# Patient Record
Sex: Male | Born: 1960 | Race: White | Hispanic: No | Marital: Married | Smoking: Former smoker
Health system: Southern US, Community
[De-identification: ages and names within clinical notes are randomized; demographics above are authoritative.]

## PROBLEM LIST (undated history)

## (undated) DIAGNOSIS — K5190 Ulcerative colitis, unspecified, without complications: Secondary | ICD-10-CM

## (undated) DIAGNOSIS — F419 Anxiety disorder, unspecified: Secondary | ICD-10-CM

## (undated) DIAGNOSIS — K5090 Crohn's disease, unspecified, without complications: Secondary | ICD-10-CM

## (undated) HISTORY — PX: NASAL SEPTUM SURGERY: SHX37

## (undated) HISTORY — PX: OTHER SURGICAL HISTORY: SHX169

## (undated) HISTORY — DX: Ulcerative colitis, unspecified, without complications: K51.90

## (undated) HISTORY — DX: Crohn's disease, unspecified, without complications: K50.90

---

## 2000-09-22 ENCOUNTER — Ambulatory Visit (HOSPITAL_COMMUNITY): Admission: RE | Admit: 2000-09-22 | Discharge: 2000-09-22 | Payer: Self-pay | Admitting: Gastroenterology

## 2002-02-14 ENCOUNTER — Ambulatory Visit (HOSPITAL_BASED_OUTPATIENT_CLINIC_OR_DEPARTMENT_OTHER): Admission: RE | Admit: 2002-02-14 | Discharge: 2002-02-14 | Payer: Self-pay | Admitting: Otolaryngology

## 2002-04-08 ENCOUNTER — Ambulatory Visit (HOSPITAL_BASED_OUTPATIENT_CLINIC_OR_DEPARTMENT_OTHER): Admission: RE | Admit: 2002-04-08 | Discharge: 2002-04-08 | Payer: Self-pay | Admitting: Otolaryngology

## 2003-08-19 ENCOUNTER — Ambulatory Visit (HOSPITAL_COMMUNITY): Admission: RE | Admit: 2003-08-19 | Discharge: 2003-08-19 | Payer: Self-pay | Admitting: Gastroenterology

## 2004-02-20 ENCOUNTER — Ambulatory Visit (HOSPITAL_BASED_OUTPATIENT_CLINIC_OR_DEPARTMENT_OTHER): Admission: RE | Admit: 2004-02-20 | Discharge: 2004-02-20 | Payer: Self-pay | Admitting: Otolaryngology

## 2010-10-11 ENCOUNTER — Other Ambulatory Visit: Payer: Self-pay | Admitting: Family Medicine

## 2010-10-11 DIAGNOSIS — R748 Abnormal levels of other serum enzymes: Secondary | ICD-10-CM

## 2010-10-18 ENCOUNTER — Ambulatory Visit
Admission: RE | Admit: 2010-10-18 | Discharge: 2010-10-18 | Disposition: A | Discharge status: Home / Self Care | Payer: BC Managed Care – PPO | Source: Ambulatory Visit | Attending: Family Medicine | Admitting: Family Medicine

## 2010-10-18 DIAGNOSIS — R748 Abnormal levels of other serum enzymes: Secondary | ICD-10-CM

## 2012-11-01 ENCOUNTER — Other Ambulatory Visit: Payer: Self-pay | Admitting: Family Medicine

## 2012-11-01 DIAGNOSIS — H9312 Tinnitus, left ear: Secondary | ICD-10-CM

## 2012-11-08 ENCOUNTER — Other Ambulatory Visit: Payer: BC Managed Care – PPO

## 2012-12-06 ENCOUNTER — Ambulatory Visit
Admission: RE | Admit: 2012-12-06 | Discharge: 2012-12-06 | Disposition: A | Discharge status: Home / Self Care | Payer: Self-pay | Source: Ambulatory Visit | Attending: Family Medicine | Admitting: Family Medicine

## 2012-12-06 DIAGNOSIS — H9312 Tinnitus, left ear: Secondary | ICD-10-CM

## 2012-12-15 ENCOUNTER — Ambulatory Visit
Admission: RE | Admit: 2012-12-15 | Discharge: 2012-12-15 | Disposition: A | Discharge status: Home / Self Care | Payer: BC Managed Care – PPO | Source: Ambulatory Visit | Attending: Family Medicine | Admitting: Family Medicine

## 2012-12-15 MED ORDER — GADOBENATE DIMEGLUMINE 529 MG/ML IV SOLN
20.0000 mL | Freq: Once | INTRAVENOUS | Status: AC | PRN
  Administered 2012-12-15: 20 mL via INTRAVENOUS

## 2013-08-26 ENCOUNTER — Ambulatory Visit (INDEPENDENT_AMBULATORY_CARE_PROVIDER_SITE_OTHER): Payer: BC Managed Care – PPO | Admitting: Diagnostic Neuroimaging

## 2013-08-26 ENCOUNTER — Encounter (INDEPENDENT_AMBULATORY_CARE_PROVIDER_SITE_OTHER): Payer: Self-pay

## 2013-08-26 ENCOUNTER — Encounter: Payer: Self-pay | Admitting: Diagnostic Neuroimaging

## 2013-08-26 VITALS — BP 118/79 | HR 75 | Ht 74.0 in | Wt 207.0 lb

## 2013-08-26 DIAGNOSIS — F0781 Postconcussional syndrome: Secondary | ICD-10-CM
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
## 2013-08-26 NOTE — Patient Instructions (Signed)
Consider referral to psychologist/psychiatry for mood eval and treatment.  Consider repeat sleep study.  Continue physical activity and good healthy habits.

## 2015-07-16 ENCOUNTER — Other Ambulatory Visit: Payer: Self-pay | Admitting: Gastroenterology

## 2016-01-16 ENCOUNTER — Encounter (HOSPITAL_COMMUNITY): Payer: Self-pay | Admitting: Emergency Medicine

## 2016-01-16 ENCOUNTER — Emergency Department (HOSPITAL_COMMUNITY)
Admission: EM | Admit: 2016-01-16 | Discharge: 2016-01-16 | Disposition: A | Discharge status: Home / Self Care | Payer: BLUE CROSS/BLUE SHIELD | Attending: Emergency Medicine | Admitting: Emergency Medicine

## 2016-01-16 DIAGNOSIS — Z79899 Other long term (current) drug therapy: Secondary | ICD-10-CM | POA: Diagnosis not present

## 2016-01-16 DIAGNOSIS — I10 Essential (primary) hypertension: Secondary | ICD-10-CM | POA: Insufficient documentation

## 2016-01-16 DIAGNOSIS — R42 Dizziness and giddiness: Secondary | ICD-10-CM

## 2016-01-16 DIAGNOSIS — Z87891 Personal history of nicotine dependence: Secondary | ICD-10-CM | POA: Diagnosis not present

## 2016-01-16 HISTORY — DX: Anxiety disorder, unspecified: F41.9

## 2016-01-16 LAB — CBC WITH DIFFERENTIAL/PLATELET
BASOS PCT: 1 %
Basophils Absolute: 0 10*3/uL (ref 0.0–0.1)
EOS ABS: 0.1 10*3/uL (ref 0.0–0.7)
Eosinophils Relative: 2 %
HCT: 42.8 % (ref 39.0–52.0)
HEMOGLOBIN: 15 g/dL (ref 13.0–17.0)
Lymphocytes Relative: 24 %
Lymphs Abs: 1.2 10*3/uL (ref 0.7–4.0)
MCH: 31.6 pg (ref 26.0–34.0)
MCHC: 35 g/dL (ref 30.0–36.0)
MCV: 90.3 fL (ref 78.0–100.0)
MONOS PCT: 10 %
Monocytes Absolute: 0.5 10*3/uL (ref 0.1–1.0)
NEUTROS PCT: 63 %
Neutro Abs: 3.2 10*3/uL (ref 1.7–7.7)
Platelets: 205 10*3/uL (ref 150–400)
RBC: 4.74 MIL/uL (ref 4.22–5.81)
RDW: 12.4 % (ref 11.5–15.5)
WBC: 5 10*3/uL (ref 4.0–10.5)

## 2016-01-16 LAB — COMPREHENSIVE METABOLIC PANEL
ALK PHOS: 65 U/L (ref 38–126)
ALT: 32 U/L (ref 17–63)
AST: 26 U/L (ref 15–41)
Albumin: 4.2 g/dL (ref 3.5–5.0)
Anion gap: 10 (ref 5–15)
BILIRUBIN TOTAL: 0.8 mg/dL (ref 0.3–1.2)
BUN: 22 mg/dL — ABNORMAL HIGH (ref 6–20)
CALCIUM: 8.8 mg/dL — AB (ref 8.9–10.3)
CO2: 21 mmol/L — AB (ref 22–32)
CREATININE: 1.09 mg/dL (ref 0.61–1.24)
Chloride: 107 mmol/L (ref 101–111)
Glucose, Bld: 114 mg/dL — ABNORMAL HIGH (ref 65–99)
Potassium: 4.2 mmol/L (ref 3.5–5.1)
Sodium: 138 mmol/L (ref 135–145)
Total Protein: 7.3 g/dL (ref 6.5–8.1)

## 2016-01-16 LAB — I-STAT TROPONIN, ED: TROPONIN I, POC: 0.01 ng/mL (ref 0.00–0.08)

## 2016-01-16 MED ORDER — THIAMINE HCL 100 MG/ML IJ SOLN
100.0000 mg | Freq: Once | INTRAMUSCULAR | Status: AC
  Administered 2016-01-16: 100 mg via INTRAVENOUS
  Filled 2016-01-16: qty 2

## 2016-01-16 MED ORDER — MECLIZINE HCL 25 MG PO TABS
25.0000 mg | ORAL_TABLET | Freq: Once | ORAL | Status: AC
  Administered 2016-01-16: 25 mg via ORAL
  Filled 2016-01-16: qty 1

## 2016-01-16 MED ORDER — SODIUM CHLORIDE 0.9 % IV BOLUS (SEPSIS)
500.0000 mL | Freq: Once | INTRAVENOUS | Status: AC
  Administered 2016-01-16: 500 mL via INTRAVENOUS

## 2016-01-16 MED ORDER — MECLIZINE HCL 32 MG PO TABS: 32.0000 mg | ORAL_TABLET | Freq: Three times a day (TID) | ORAL | 0 refills | Status: DC | PRN

## 2016-01-16 NOTE — ED Triage Notes (Signed)
Patient states having dizziness and "feels drunk but I havent drunk anything" and symptoms are worse with standing and moving.  Patient states that he has HTN at times but denies taking any medications for it.  States that he has Lexapro for anxiety but doesn't take everyday.

## 2016-01-16 NOTE — Discharge Instructions (Signed)
We believe your symptoms were caused by benign vertigo.  Please read through the included information and take any prescribed medication(s).  Follow up with your doctor as listed above.  If you develop any new or worsening symptoms that concern you, including but not limited to persistent dizziness/vertigo, numbness or weakness in your arms or legs, altered mental status, persistent vomiting, or fever greater than 101, please return immediately to the Emergency Department.  
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
## 2016-01-20 ENCOUNTER — Ambulatory Visit (INDEPENDENT_AMBULATORY_CARE_PROVIDER_SITE_OTHER): Payer: BLUE CROSS/BLUE SHIELD | Admitting: Neurology

## 2016-01-20 ENCOUNTER — Encounter: Payer: Self-pay | Admitting: Neurology

## 2016-01-20 DIAGNOSIS — H9319 Tinnitus, unspecified ear: Secondary | ICD-10-CM | POA: Insufficient documentation

## 2016-01-20 DIAGNOSIS — H9311 Tinnitus, right ear: Secondary | ICD-10-CM | POA: Diagnosis not present

## 2016-01-20 DIAGNOSIS — R42 Dizziness and giddiness: Secondary | ICD-10-CM
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
## 2016-01-20 NOTE — Patient Instructions (Addendum)
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 I recommend checking MRI scan of the brain with and without contrast with thin sections through the internal auditory canals to rule out structural lesions. Referred to vestibular rehabilitation for dizziness exercises. I advised him to take Lexapro daily for his underlying anxiety. Return for follow-up in 2 months or call earlier if necessary.   Dizziness Dizziness is a common problem. It is a feeling of unsteadiness or light-headedness. You may feel like you are about to faint. Dizziness can lead to injury if you stumble or fall. Anyone can become dizzy, but dizziness is more common in older adults. This condition can be caused by a number of things, including medicines, dehydration, or illness. HOME CARE INSTRUCTIONS Taking these steps may help with your condition: Eating and Drinking  Drink enough fluid to keep your urine clear or pale yellow. This helps to keep you from becoming dehydrated. Try to drink more clear fluids, such as water.  Do not drink alcohol.  Limit your caffeine intake if directed by your health care provider.  Limit your salt intake if directed by your health care provider. Activity  Avoid making quick movements.  Rise slowly from chairs and steady yourself until you feel okay.  In the morning, first sit up on the side of the bed. When you feel okay, stand slowly while you hold onto something until you know that your balance is fine.  Move your legs often if you need to stand in one place for a long time. Tighten and relax your muscles in your legs while you are standing.  Do not drive or operate heavy machinery if you feel dizzy.  Avoid bending down if you feel dizzy. Place items in your home so that they are easy for you to reach without leaning  over. Lifestyle  Do not use any tobacco products, including cigarettes, chewing tobacco, or electronic cigarettes. If you need help quitting, ask your health care provider.  Try to reduce your stress level, such as with yoga or meditation. Talk with your health care provider if you need help. General Instructions  Watch your dizziness for any changes.  Take medicines only as directed by your health care provider. Talk with your health care provider if you think that your dizziness is caused by a medicine that you are taking.  Tell a friend or a family member that you are feeling dizzy. If he or she notices any changes in your behavior, have this person call your health care provider.  Keep all follow-up visits as directed by your health care provider. This is important. SEEK MEDICAL CARE IF:  Your dizziness does not go away.  Your dizziness or light-headedness gets worse.  You feel nauseous.  You have reduced hearing.  You have new symptoms.  You are unsteady on your feet or you feel like the room is spinning. SEEK IMMEDIATE MEDICAL CARE IF:  You vomit or have diarrhea and are unable to eat or drink anything.  You have problems talking, walking, swallowing, or using your arms, hands, or legs.  You feel generally weak.  You are not thinking clearly or you have trouble forming sentences. It may take a friend or family member to notice this.  You have chest pain, abdominal pain, shortness of breath, or sweating.  Your vision changes.  You notice any bleeding.  You have a headache.  You have neck pain or a stiff neck.  You have a fever.   This information is not intended to replace advice given to you by your health care provider. Make sure you discuss any questions you have with your health care provider.   Document Released: 11/16/2000 Document Revised: 10/07/2014 Document Reviewed: 05/19/2014 Elsevier Interactive Patient Education [employer]2016 Elsevier Inc.

## 2016-02-12 ENCOUNTER — Ambulatory Visit: Payer: BLUE CROSS/BLUE SHIELD | Attending: Neurology | Admitting: Physical Therapy

## 2016-02-12 DIAGNOSIS — R2681 Unsteadiness on feet: Secondary | ICD-10-CM

## 2016-02-12 DIAGNOSIS — R42 Dizziness and giddiness: Secondary | ICD-10-CM | POA: Diagnosis present

## 2016-02-14 NOTE — Patient Instructions (Signed)
Gaze Stabilization: Tip Card 1.Target must remain in focus, not blurry, and appear stationary while head is in motion. 2.Perform exercises with small head movements (45 to either side of midline). 3.Increase speed of head motion so long as target is in focus. 4.If you wear eyeglasses, be sure you can see target through lens (therapist will give specific instructions for bifocal / progressive lenses). 5.These exercises may provoke dizziness or nausea. Work through these symptoms. If too dizzy, slow head movement slightly. Rest between each exercise. 6.Exercises demand concentration; avoid distractions. 7.For safety, perform standing exercises close to a counter, wall, corner, or next to someone.  Copyright  VHI. All rights reserved.  Gaze Stabilization: Standing Feet Apart   Feet shoulder width apart, keeping eyes on target on wall __6__ feet away, tilt head down 15-30 and move head side to side for __60__ seconds. Repeat while moving head up and down for __60__ seconds. Do _3-5___ sessions per day. Repeat using target on pattern background.  Copyright  VHI. All rights reserved.   

## 2016-02-14 NOTE — Therapy (Signed)
[facility] [address] [place], [place], [address] Phone: [phone]   Fax:  [phone]  Physical Therapy Evaluation  Patient Details  Name: Donald Norris MRN: [id number] Date of Birth: [birth year] Referring Provider: Dr. Delia Heady  Encounter Date: 02/12/2016      PT End of Session - 02/14/16 1221    Visit Number 1   Number of Visits 5  eval + 4 visits   Date for PT Re-Evaluation 03/13/16   Authorization Type BCBS   PT Start Time 0800   PT Stop Time 0845   PT Time Calculation (min) 45 min      Past Medical History:  Diagnosis Date  . Anxiety   . Crohn disease (HCC)   . Ulcerative colitis [facility])     Past Surgical History:  Procedure Laterality Date  . NASAL SEPTUM SURGERY  1998, 2005   x 2  . sinus surgey      There were no vitals filed for this visit.       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 No/denies            [facility] PT Assessment - 02/14/16 0001      Assessment   Medical Diagnosis Dizziness   Referring Provider Dr. Delia Heady   Onset Date/Surgical Date 01/16/16     Precautions   Precautions None     Balance Screen   Has the patient fallen in the past 6 months No   Has the patient had a decrease in activity level because of a fear of falling?  No   Is the patient reluctant to leave their home because of a fear of falling?  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 old male who reports acute onset of vertigo in am of 01-16-16:  states it has significantly improved since onset but states he still doesn't feel normal     Symptom Behavior   Type of Dizziness Unsteady with head/body turns   Frequency of Dizziness constant  varies in intensity   Aggravating Factors Turning head quickly   Relieving Factors Rest  Occulomotor Exam   Occulomotor Alignment Normal   Smooth Pursuits Intact   Saccades Intact     Visual Acuity   Static line 10   Dynamic line 7     Positional Testing   Sidelying Test Sidelying Right;Sidelying Left     Sidelying Right   Sidelying Right Duration none  reports light-headedness with return to upright   Sidelying Right Symptoms No nystagmus     Sidelying Left   Sidelying Left Duration no dizziness reported in sidelying position  c/o light-headedness with return to upright    Sidelying Left Symptoms No nystagmus                       PT Education - 02/14/16 1220    Education provided Yes   Education Details x1 viewing in standing   Person(s) Educated Patient   Methods Explanation;Demonstration;Handout   Comprehension Verbalized understanding;Returned demonstration             PT Long Term Goals - 02/14/16 1226      PT LONG TERM GOAL #1   Title Perform SOT and establish LTG as appropriate.  (03-13-16)   Time 4   Period Weeks   Status New     PT LONG TERM GOAL #2   Title Pt will improve DVA to </= 2 line difference to demo improved VOR function.  (03-13-16)   Baseline 3 line difference   Time 4   Period Weeks   Status New     PT LONG TERM GOAL #3   Title Independent in HEP for vestibular/balance exercises.  (03-13-16)   Time 4   Period Weeks   Status New     PT  LONG TERM GOAL #4   Title Report at least 50% improvement in vertigo.  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     Rehab Potential Good   PT Frequency 1x / week   PT Duration 4 weeks   PT Treatment/Interventions ADLs/Self Care Home Management;Canalith Repostioning;Gait training;Therapeutic activities;Patient/family education;Neuromuscular re-education;Vestibular   PT Next Visit Plan Do SOT and establish HEP as appropriate   PT Home Exercise Plan x1 viewing in standing   Consulted and Agree with Plan of Care Patient      Patient will benefit from skilled therapeutic intervention in order to improve the following deficits and impairments:  Dizziness, Decreased balance  Visit Diagnosis: Dizziness and giddiness - Plan: PT plan of care cert/re-cert  Unsteadiness on feet - Plan: PT plan of care cert/re-cert     Problem List Patient Active Problem List   Diagnosis Date Noted  . Dizziness and giddiness 01/20/2016  . Tinnitus 01/20/2016  . Post concussion syndrome 08/26/2013    Kary KosDilday, Candance Bohlman Suzanne, PT 02/14/2016, 12:37 PM  [facility] [address] [place], [place], [address] Phone: [phone]   Fax:  [phone]  Name: Donald Norris MRN: [id number] Date of Birth: 03-06-1961

## 2016-02-15 ENCOUNTER — Ambulatory Visit: Payer: BLUE CROSS/BLUE SHIELD | Admitting: Physical Therapy

## 2016-02-15 ENCOUNTER — Ambulatory Visit (INDEPENDENT_AMBULATORY_CARE_PROVIDER_SITE_OTHER): Payer: BLUE CROSS/BLUE SHIELD

## 2016-02-15 DIAGNOSIS — R42 Dizziness and giddiness: Secondary | ICD-10-CM | POA: Diagnosis not present

## 2016-02-15 DIAGNOSIS — H9311 Tinnitus, right ear: Secondary | ICD-10-CM

## 2016-02-17 NOTE — Therapy (Signed)
[facility] [address] [place], [place], [address] Phone: [phone]   Fax:  [phone]  Physical Therapy Treatment  Patient Details  Name: Donald Norris MRN: [id number] Date of Birth: [birth year] Referring Provider: Dr. Antony Contras  Encounter Date: 02/15/2016      PT End of Session - 02/17/16 1036    Visit Number 2   Number of Visits 5   Date for PT Re-Evaluation 03/13/16   Authorization Type BCBS   PT Start Time 1315   PT Stop Time 6468   PT Time Calculation (min) 43 min      Past Medical History:  Diagnosis Date  . Anxiety   . Crohn disease ([place])   . Ulcerative colitis [facility])     Past Surgical History:  Procedure Laterality Date  . [place], 2005   x 2  . sinus surgey      There were no vitals filed for this visit.      Subjective Assessment - 02/17/16 1035    Subjective Pt reports Saturday was "a bad day" - was playing a racing video game on big screen TV and had to go lie down - was sensitive to light and movement after this activity   Patient Stated Goals resolve the vertigo   Currently in Pain? No/denies     NeuroRe-ed: Marketing executive test score WNL's with score 79/100 (N= 70/100) All inputs of somatosensory, visual and vestibular are WNL's  Pt had no falls during any trials of SOT test with only 3 trials scoring slightly below normal -- no vestibular dysfunction Detected with balance with this test  Dynamic visual acuity test now 2 line difference - tested without pt wearing progressive eyeglasses - he reports he thinks the glasses  Worn during testing on eval contributed to abnormal score as they are progressive lenses and things are blurred with quick head Movement with glasses worn   Self care; Discussed LTG's, progress and current status  Pt appears to have initially had BPPV which has now resolved - with symptoms now appearing to be of migrainous in  nature with no Vestibular hypofunction noted at this time -  Pt to follow up with MD for further diagnostic testing (BAER test scheduled)                                 PT Long Term Goals - 02/17/16 1040      PT LONG TERM GOAL #1   Title Perform SOT and establish LTG as appropriate.  (03-13-16)   Baseline completed 02-15-16 ; score WNL's   Status Achieved     PT LONG TERM GOAL #2   Title Pt will improve DVA to </= 2 line difference to demo improved VOR function.  (03-13-16)   Baseline 2 line difference on 02-15-16   Status Achieved     PT LONG TERM GOAL #3   Title Independent in HEP for vestibular/balance exercises.  (03-13-16)   Baseline met 02-15-16   Status Achieved     PT LONG TERM GOAL #4   Title Report at least 50% improvement in vertigo.  (03-13-16)   Baseline met 02-15-16   Status Achieved               Plan - 02/17/16 1037    Clinical Impression Statement Sensory Organization test score WNL's with all inputs of somatosensory, visual and vestibular WNL's --  no vestibular deficit noted per SOT in maintaining balance:  pt symptoms appear to be possibly vertiginous migraine in nature - pt is scheduled for BAER testing this pm with neurologist; DVA is WNL's with 2 line difference - symptoms do not appear to be of vestibular system dysfunction at this time - pt to follow up with MD   Rehab Potential Good   PT Frequency 1x / week   PT Duration 4 weeks   PT Treatment/Interventions ADLs/Self Care Home Management;Canalith Repostioning;Gait training;Therapeutic activities;Patient/family education;Neuromuscular re-education;Vestibular   PT Next Visit Plan D/C - SOT WNL's and DVA now WNL's   Consulted and Agree with Plan of Care Patient      Patient will benefit from skilled therapeutic intervention in order to improve the following deficits and impairments:  Dizziness, Decreased balance  Visit Diagnosis: Dizziness and giddiness     Problem  List Patient Active Problem List   Diagnosis Date Noted  . Dizziness and giddiness 01/20/2016  . Tinnitus 01/20/2016  . Post concussion syndrome 08/26/2013     PHYSICAL THERAPY DISCHARGE SUMMARY  Visits from Start of Care: 2  Current functional level related to goals / functional outcomes: See above for progress towards goals   Remaining deficits: C/o headache at times with activity requiring visual stimulation/oculomotor (i.e. Racing video game on big screen TV) - migrainous in nature? Pt appears to have had BPPV initially but this has resolved as of current time   DVA and SOT scores are WNL's with balance WNL's Education / Equipment: Pt was initially instructed in gaze stabilization, x1 viewing, for HEP - now DVA is WNL's and balance is WNL's - this exercise is no longer needed  Pt to follow up with MD for further diagnostic testing Plan: Patient agrees to discharge.  Patient goals were met. Patient is being discharged due to meeting the stated rehab goals.  ?????       Alda Lea, PT 02/17/2016, 10:42 AM  [facility] [address] [place], [place], [address] Phone: [phone]   Fax:  [phone]  Name: Donald Norris MRN: [id number] Date of Birth: Oct 22, 1960

## 2016-02-29 ENCOUNTER — Telehealth: Payer: Self-pay

## 2016-02-29 NOTE — Telephone Encounter (Signed)
LFt vm for patient to call back about Brainstem auditory potentials test.

## 2016-02-29 NOTE — Telephone Encounter (Signed)
-----   Message from Micki RileyPramod S Sethi, MD sent at 02/25/2016  3:27 PM EDT ----- Donald RoachKindly inform the patient that brainstem auditory potentials were normal bilaterally.

## 2016-03-01 NOTE — Telephone Encounter (Signed)
Rn call patient that his brainstem auditory potentials were normal bilaterally. Pt was happy and verbalized understanding.

## 2016-10-21 DIAGNOSIS — E785 Hyperlipidemia, unspecified: Secondary | ICD-10-CM | POA: Diagnosis not present

## 2016-10-21 DIAGNOSIS — N183 Chronic kidney disease, stage 3 (moderate): Secondary | ICD-10-CM | POA: Diagnosis not present

## 2017-02-13 DIAGNOSIS — K5150 Left sided colitis without complications: Secondary | ICD-10-CM | POA: Diagnosis not present

## 2017-07-14 DIAGNOSIS — R197 Diarrhea, unspecified: Secondary | ICD-10-CM | POA: Diagnosis not present

## 2017-07-14 DIAGNOSIS — E860 Dehydration: Secondary | ICD-10-CM | POA: Diagnosis not present

## 2017-07-14 DIAGNOSIS — R42 Dizziness and giddiness: Secondary | ICD-10-CM | POA: Diagnosis not present

## 2017-08-18 DIAGNOSIS — J0100 Acute maxillary sinusitis, unspecified: Secondary | ICD-10-CM | POA: Diagnosis not present

## 2018-03-30 DIAGNOSIS — J019 Acute sinusitis, unspecified: Secondary | ICD-10-CM | POA: Diagnosis not present

## 2018-05-02 DIAGNOSIS — E785 Hyperlipidemia, unspecified: Secondary | ICD-10-CM | POA: Diagnosis not present

## 2018-05-02 DIAGNOSIS — Z6829 Body mass index (BMI) 29.0-29.9, adult: Secondary | ICD-10-CM | POA: Diagnosis not present

## 2018-05-02 DIAGNOSIS — Z0000 Encounter for general adult medical examination without abnormal findings: Secondary | ICD-10-CM | POA: Diagnosis not present

## 2018-05-02 DIAGNOSIS — Z23 Encounter for immunization: Secondary | ICD-10-CM | POA: Diagnosis not present

## 2018-07-03 DIAGNOSIS — M79642 Pain in left hand: Secondary | ICD-10-CM | POA: Diagnosis not present

## 2019-05-15 ENCOUNTER — Other Ambulatory Visit: Payer: Self-pay

## 2019-05-15 ENCOUNTER — Encounter (HOSPITAL_COMMUNITY): Payer: Self-pay | Admitting: Emergency Medicine

## 2019-05-15 ENCOUNTER — Emergency Department (HOSPITAL_COMMUNITY): Payer: BLUE CROSS/BLUE SHIELD

## 2019-05-15 ENCOUNTER — Emergency Department (HOSPITAL_COMMUNITY)
Admission: EM | Admit: 2019-05-15 | Discharge: 2019-05-16 | Disposition: A | Discharge status: Home / Self Care | Payer: BLUE CROSS/BLUE SHIELD | Attending: Emergency Medicine | Admitting: Emergency Medicine

## 2019-05-15 DIAGNOSIS — U071 COVID-19: Secondary | ICD-10-CM | POA: Diagnosis not present

## 2019-05-15 DIAGNOSIS — Z87891 Personal history of nicotine dependence: Secondary | ICD-10-CM | POA: Insufficient documentation

## 2019-05-15 DIAGNOSIS — J1289 Other viral pneumonia: Secondary | ICD-10-CM | POA: Diagnosis not present

## 2019-05-15 DIAGNOSIS — Z79899 Other long term (current) drug therapy: Secondary | ICD-10-CM | POA: Diagnosis not present

## 2019-05-15 LAB — I-STAT CHEM 8, ED
BUN: 15 mg/dL (ref 6–20)
Calcium, Ion: 1.06 mmol/L — ABNORMAL LOW (ref 1.15–1.40)
Chloride: 96 mmol/L — ABNORMAL LOW (ref 98–111)
Creatinine, Ser: 1.1 mg/dL (ref 0.61–1.24)
Glucose, Bld: 107 mg/dL — ABNORMAL HIGH (ref 70–99)
HCT: 44 % (ref 39.0–52.0)
Hemoglobin: 15 g/dL (ref 13.0–17.0)
Potassium: 3.9 mmol/L (ref 3.5–5.1)
Sodium: 132 mmol/L — ABNORMAL LOW (ref 135–145)
TCO2: 26 mmol/L (ref 22–32)

## 2019-05-15 NOTE — ED Provider Notes (Signed)
[facility] DEPT Provider Note   CSN: [id number] Arrival date & time: 05/15/19  1613     History   Chief Complaint Chief Complaint  Patient presents with  . Cough  . covid positive    HPI Donald Norris is a 58 y.o. male.     HPI Patient presents with concern for possible hypoxia, tachycardia. Patient was diagnosed with Covid 6 days ago after initially becoming ill about 9 days ago. He notes that he is generally well, does have a history of ulcerative colitis, possibly, but has had no recent issues with this. He notes that he is generally recovering well, denies focal pain confusion, disorientation, vomiting, diarrhea. Today, checking his vital signs at home he found himself to have pulse oximetry in the 92, 93% range, and tachycardia, with heart rate greater than 100. After speaking with his physician he was encouraged to go to the emergency department for evaluation. He notes that he has been taking supplements, and there is no clear precipitant for today's finding of abnormal vital signs. Past Medical History:  Diagnosis Date  . Anxiety   . Crohn disease ([place])   . Ulcerative colitis [facility])     Patient Active Problem List   Diagnosis Date Noted  . Dizziness and giddiness 01/20/2016  . Tinnitus 01/20/2016  . Post concussion syndrome 08/26/2013    Past Surgical History:  Procedure Laterality Date  . NASAL SEPTUM SURGERY  1998, 2005   x 2  . sinus surgey          Home Medications    Prior to Admission medications   Medication Sig Start Date End Date Taking? Authorizing Provider  escitalopram (LEXAPRO) 10 MG tablet Take 10 mg by mouth every other day.  12/24/15   [provider]  Mesalamine (ASACOL PO) Take 200 mg by mouth daily.    [provider]  Multiple Vitamins-Minerals (MULTIVITAMIN ADULT) TABS Take 1 tablet by mouth daily.    [provider]    Family History Family History  Problem Relation Age  of Onset  . Other Father        car accident  . Stroke Maternal Grandfather   . Dementia Maternal Grandfather     Social History Social History   Tobacco Use  . Smoking status: Former Smoker    Packs/day: 1.00    Years: 10.00    Pack years: 10.00    Types: Cigarettes    Quit date: 06/06/1985    Years since quitting: 33.9  . Smokeless tobacco: Never Used  Substance Use Topics  . Alcohol use: No  . Drug use: No     Allergies   Patient has no known allergies.   Review of Systems Review of Systems  Constitutional:       Per HPI, otherwise negative  HENT:       Per HPI, otherwise negative  Respiratory:       Per HPI, otherwise negative  Cardiovascular:       Per HPI, otherwise negative  Gastrointestinal: Negative for vomiting.  Endocrine:       Negative aside from HPI  Genitourinary:       Neg aside from HPI   Musculoskeletal:       Per HPI, otherwise negative  Skin: Negative.   Neurological: Negative for syncope.     Physical Exam Updated Vital Signs BP (!) 166/94   Pulse 93   Temp 99.1 F (37.3 C) (Oral)   Resp 18  Ht 6\' 2"  (1.88 m)   Wt 104.3 kg   SpO2 96%   BMI 29.53 kg/m   Physical Exam Vitals signs and nursing note reviewed.  Constitutional:      General: He is not in acute distress.    Appearance: He is well-developed.  HENT:     Head: Normocephalic and atraumatic.  Eyes:     Conjunctiva/sclera: Conjunctivae normal.  Cardiovascular:     Rate and Rhythm: Normal rate and regular rhythm.  Pulmonary:     Effort: Pulmonary effort is normal. No respiratory distress.     Breath sounds: No stridor.  Abdominal:     General: There is no distension.  Skin:    General: Skin is warm and dry.  Neurological:     Mental Status: He is alert and oriented to person, place, and time.      ED Treatments / Results  Labs (all labs ordered are listed, but only abnormal results are displayed) Labs Reviewed  I-STAT CHEM 8, ED    Radiology Dg  Chest 2 View  Result Date: 05/15/2019 CLINICAL DATA:  COVID-19 positivity with productive cough for several days, initial encounter EXAM: CHEST - 2 VIEW COMPARISON:  07/29/2011 FINDINGS: Cardiac shadow is within normal limits. The lungs are well aerated bilaterally. Patchy infiltrate is seen in the right lung base primarily projecting in the right middle lobe. No bony abnormality is seen. IMPRESSION: Right basilar infiltrate. Electronically Signed   By: 07/31/2011 M.D.   On: 05/15/2019 17:01    Procedures Procedures (including critical care time)  Medications Ordered in ED Medications - No data to display   Initial Impression / Assessment and Plan / ED Course  I have reviewed the triage vital signs and the nursing notes.  Pertinent labs & imaging results that were available during my care of the patient were reviewed by me and considered in my medical decision making (see chart for details).    Pulse oximetry 97% room air unremarkable    Exam patient is awake, alert. X-ray consistent with Covid pneumonia, patient has no persistent tachycardia, nor hypoxia, has no evidence for distress, is a soft abdomen, i-STAT chemistry reassuring, no evidence for renal dysfunction, substantial dehydration. Patient appropriate for discharge with close outpatient follow-up, for his coronavirus infection.  Donald Norris was evaluated in Emergency Department on 05/15/2019 for the symptoms described in the history of present illness. He was evaluated in the context of the global COVID-19 pandemic, which necessitated consideration that the patient might be at risk for infection with the SARS-CoV-2 virus that causes COVID-19. Institutional protocols and algorithms that pertain to the evaluation of patients at risk for COVID-19 are in a state of rapid change based on information released by regulatory bodies including the CDC and federal and state organizations. These policies and algorithms were followed during  the patient's care in the ED.  Final Clinical Impressions(s) / ED Diagnoses   Final diagnoses:  Pneumonia due to COVID-19 virus      14/02/2019, MD 05/15/19 2314

## 2019-05-15 NOTE — Discharge Instructions (Signed)
As discussed, your evaluation today has been largely reassuring.  But, it is important that you monitor your condition carefully, and do not hesitate to return to the ED if you develop new, or concerning changes in your condition. ? ?Otherwise, please follow-up with your physician for appropriate ongoing care. ? ?

## 2019-05-15 NOTE — ED Triage Notes (Signed)
Patient c/o cough, fever, and chills.  Fever yesterday 100.7.  Was tested at [facility] on Friday and is COVID positive. Patient took tylenol approx 1 hour ago.

## 2020-05-03 IMAGING — CR DG CHEST 2V
2 series · 2 of 2 positions shown · non-contrast
Comparison: 07/29/2011

CLINICAL DATA: [id number] positivity with productive cough for
several days, initial encounter

EXAM:
CHEST - 2 VIEW

[w chest pa]
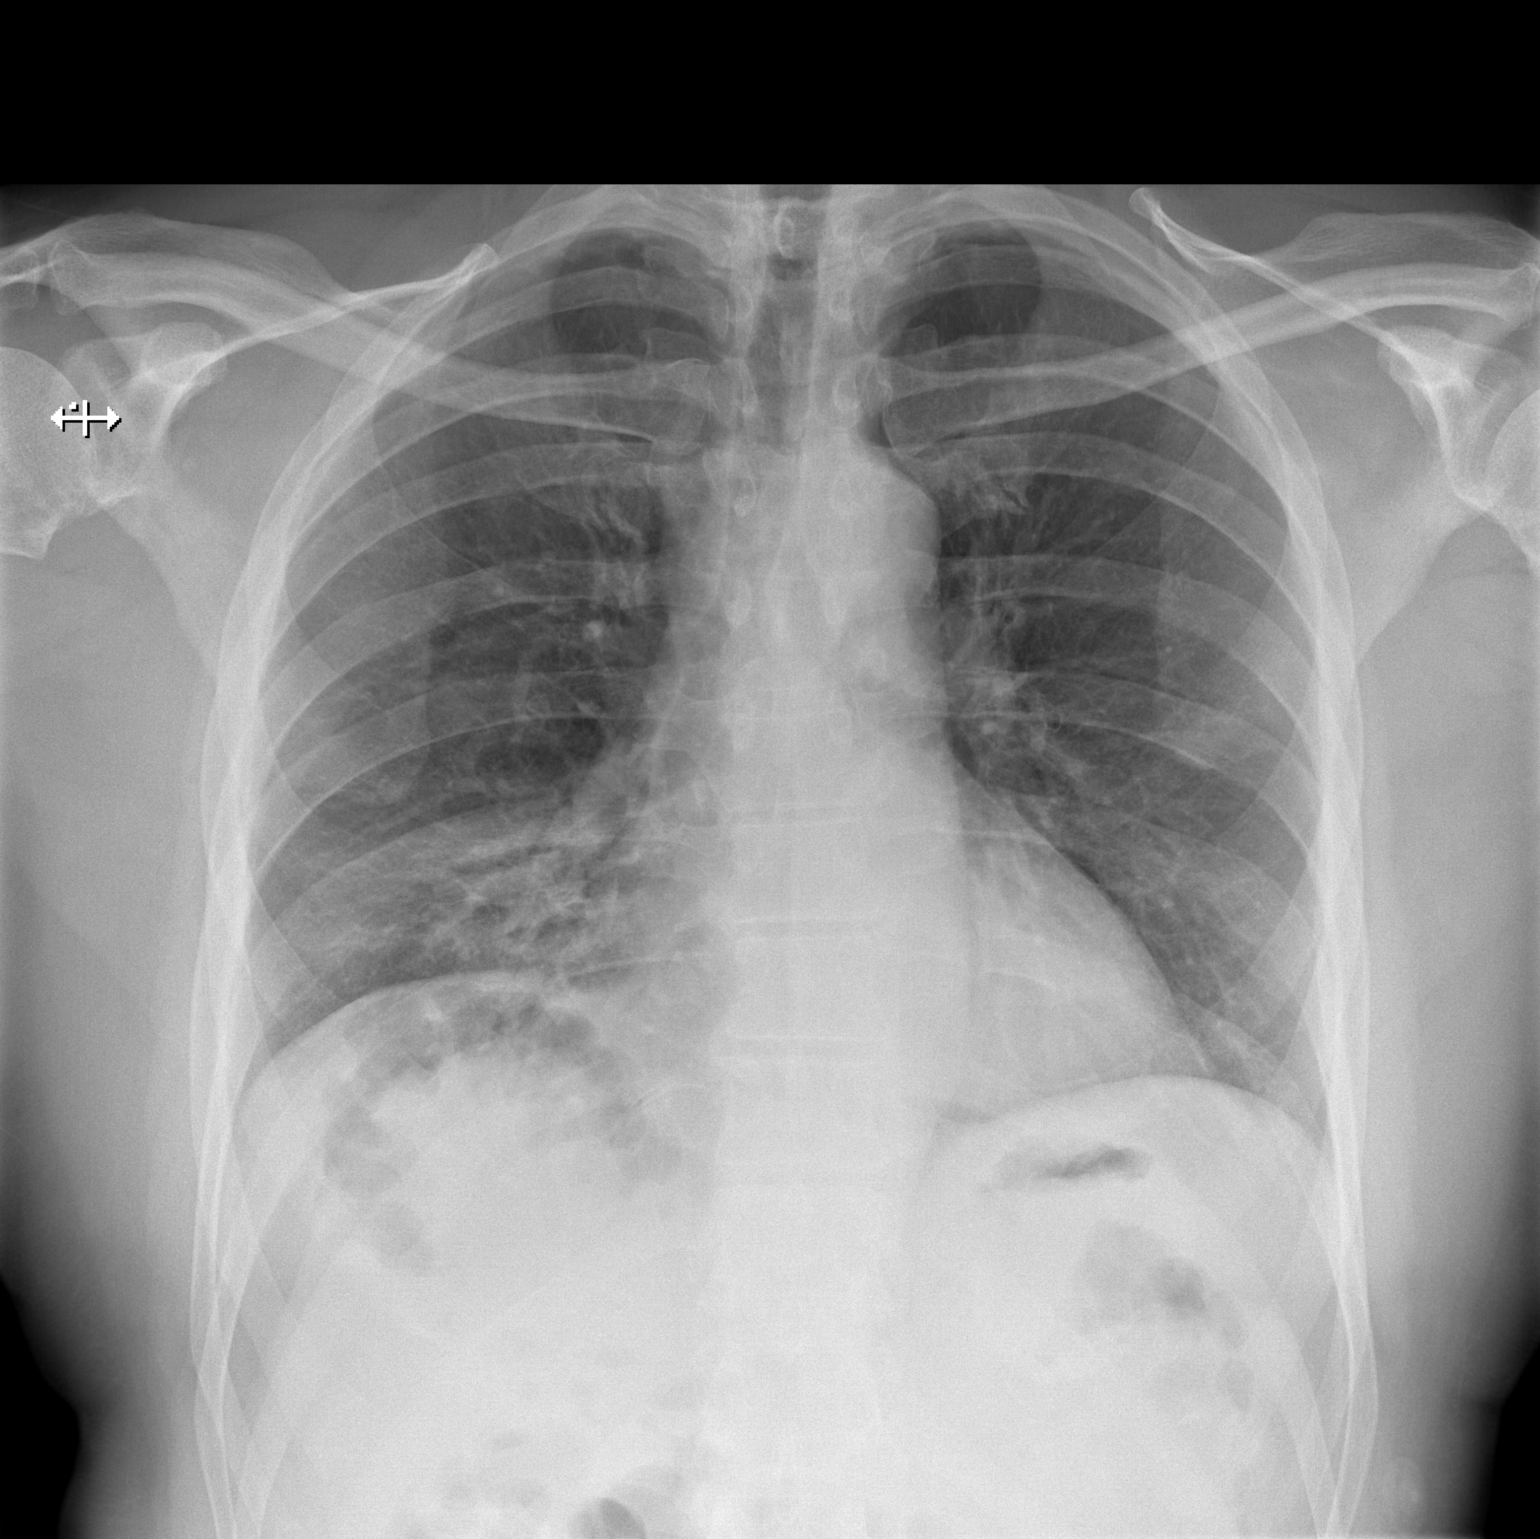

[w chest lat]
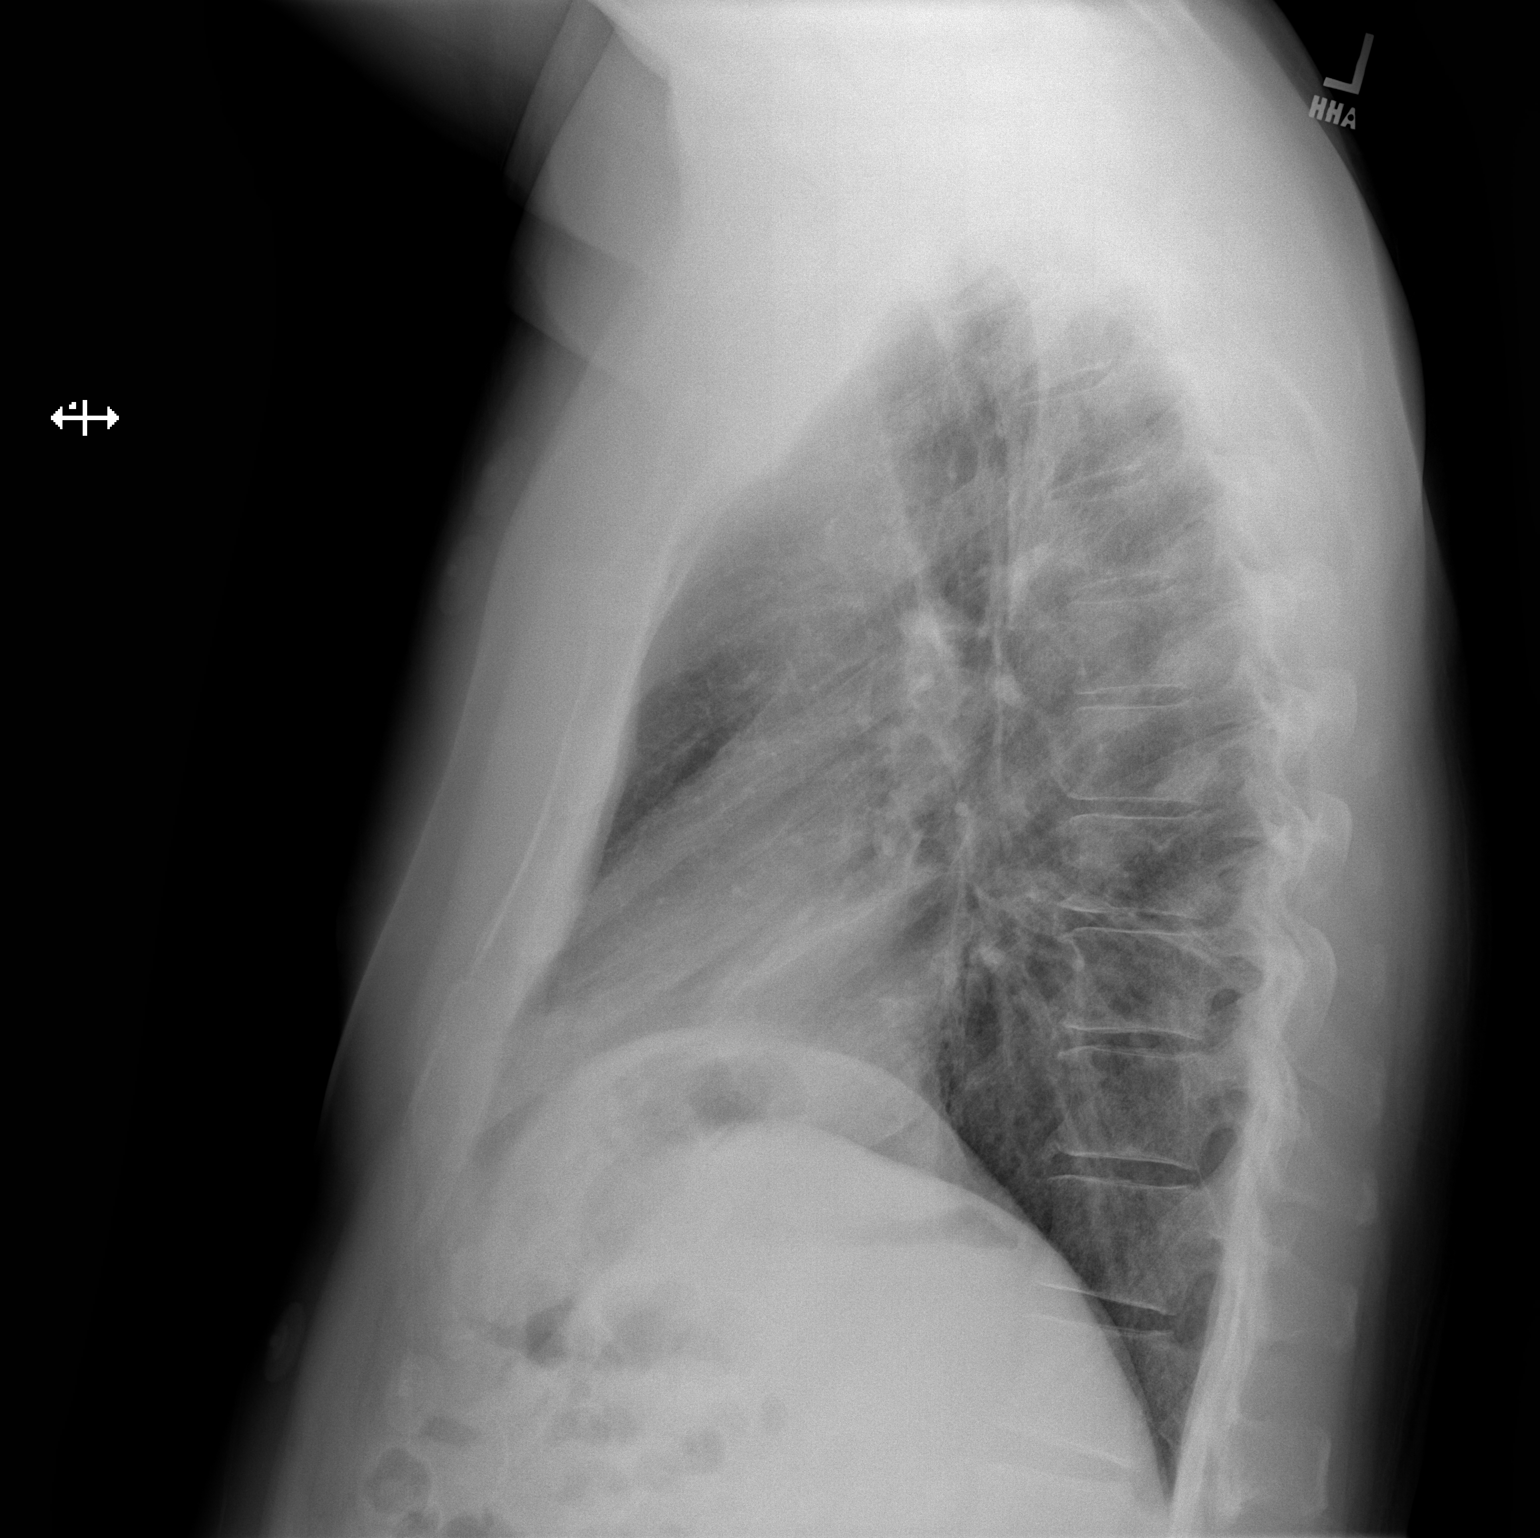

[2 of 2 positions shown; findings below may reference images not displayed]

FINDINGS: Cardiac shadow is within normal limits. The lungs are well aerated
bilaterally. Patchy infiltrate is seen in the right lung base
primarily projecting in the right middle lobe. No bony abnormality
is seen.
IMPRESSION: Right basilar infiltrate.

## 2021-03-17 ENCOUNTER — Ambulatory Visit (HOSPITAL_BASED_OUTPATIENT_CLINIC_OR_DEPARTMENT_OTHER): Payer: BLUE CROSS/BLUE SHIELD | Admitting: Cardiology

## 2021-04-26 ENCOUNTER — Ambulatory Visit (HOSPITAL_BASED_OUTPATIENT_CLINIC_OR_DEPARTMENT_OTHER): Payer: Self-pay | Admitting: Cardiology

## 2021-08-23 DIAGNOSIS — J019 Acute sinusitis, unspecified: Secondary | ICD-10-CM | POA: Diagnosis not present

## 2021-11-10 DIAGNOSIS — M170 Bilateral primary osteoarthritis of knee: Secondary | ICD-10-CM | POA: Diagnosis not present

## 2022-08-29 DIAGNOSIS — M170 Bilateral primary osteoarthritis of knee: Secondary | ICD-10-CM | POA: Diagnosis not present

## 2022-09-05 DIAGNOSIS — M170 Bilateral primary osteoarthritis of knee: Secondary | ICD-10-CM | POA: Diagnosis not present

## 2022-09-12 DIAGNOSIS — M170 Bilateral primary osteoarthritis of knee: Secondary | ICD-10-CM | POA: Diagnosis not present

## 2022-10-05 DIAGNOSIS — M170 Bilateral primary osteoarthritis of knee: Secondary | ICD-10-CM | POA: Diagnosis not present

## 2022-10-24 ENCOUNTER — Encounter: Payer: Self-pay | Admitting: Cardiology

## 2022-10-24 ENCOUNTER — Ambulatory Visit: Payer: 59 | Admitting: Cardiology

## 2022-10-24 VITALS — BP 137/85 | HR 75 | Ht 74.0 in | Wt 230.0 lb

## 2022-10-24 DIAGNOSIS — I10 Essential (primary) hypertension: Secondary | ICD-10-CM | POA: Diagnosis not present

## 2022-10-24 DIAGNOSIS — E782 Mixed hyperlipidemia: Secondary | ICD-10-CM | POA: Diagnosis not present

## 2022-10-24 HISTORY — DX: Essential (primary) hypertension: I10

## 2022-10-24 NOTE — Progress Notes (Signed)
Patient referred by Donald Hazel, MD for hyperlipidemia  Subjective:   Donald Norris, male    DOB: April 15, 1961, 62 y.o.   MRN: [id number]   Chief Complaint  Patient presents with   Essential hypertension   Mixed hyperlipidemia   New Patient (Initial Visit)     HPI  62 y.o. Caucasian male with mild Crohn's disease, hyperlipidemia  Patient has no significant family h/o heart disease. He is very active, exercises regularly without any complaints of chest pain, shortness of breath. Blood pressure initially elevated today, but generally well controlled. Reviewed recent test results with the patient, details below. Historically, he has been hesitant to be on statin.    Past Medical History:  Diagnosis Date   Anxiety    Crohn disease (HCC)    Ulcerative colitis (HCC)      Past Surgical History:  Procedure Laterality Date   NASAL SEPTUM SURGERY  1998, 2005   x 2   sinus surgey       Social History   Tobacco Use  Smoking Status Former   Packs/day: 1.00   Years: 10.00   Additional pack years: 0.00   Total pack years: 10.00   Types: Cigarettes   Quit date: 06/06/1985   Years since quitting: 37.4  Smokeless Tobacco Never    Social History   Substance and Sexual Activity  Alcohol Use No     Family History  Problem Relation Age of Onset   Other Father        car accident   Stroke Maternal Grandfather    Dementia Maternal Grandfather       Current Outpatient Medications:    escitalopram (LEXAPRO) 5 MG tablet, Take 1 tablet by mouth daily., Disp: , Rfl:    mesalamine (APRISO) 0.375 g 24 hr capsule, Take 0.375 g by mouth daily., Disp: , Rfl:    Multiple Vitamins-Minerals (MULTIVITAMIN ADULT) TABS, Take 1 tablet by mouth daily., Disp: , Rfl:    Omega-3 Fatty Acids (FISH OIL) 1000 MG CAPS, Take 1,000 mg by mouth daily., Disp: , Rfl:    Cardiovascular and other pertinent studies:  Reviewed external labs and tests, independently interpreted  EKG  10/24/2022: Sinus rhythm 72 bpm Normal EKG    Recent labs: 08/03/2022: Glucose 114, BUN/Cr 16/1.3. EGFR 62. K 4.7. Hb 14.9. HbA1C 5.6% Chol 253, TG 82, HDL 54, LDL 185 TSH 1.3 normal   Review of Systems  Cardiovascular:  Negative for chest pain, dyspnea on exertion, leg swelling, palpitations and syncope.         Vitals:   10/24/22 1333  BP: (!) 143/80  Pulse: 74  SpO2: 98%     Body mass index is 29.53 kg/m. Filed Weights   10/24/22 1333  Weight: 230 lb (104.3 kg)     Objective:   Physical Exam Vitals and nursing note reviewed.  Constitutional:      General: He is not in acute distress. Neck:     Vascular: No JVD.  Cardiovascular:     Rate and Rhythm: Normal rate and regular rhythm.     Heart sounds: Normal heart sounds. No murmur heard. Pulmonary:     Effort: Pulmonary effort is normal.     Breath sounds: Normal breath sounds. No wheezing or rales.  Musculoskeletal:     Right lower leg: No edema.     Left lower leg: No edema.         Visit diagnoses:   ICD-10-CM   1. Essential hypertension  I10 EKG 12-Lead    2. Mixed hyperlipidemia  E78.2 CT CARDIAC SCORING (SELF PAY ONLY)       Orders Placed This Encounter  Procedures   CT CARDIAC SCORING (SELF PAY ONLY)   EKG 12-Lead      Assessment & Recommendations:    62 y.o. Caucasian male with mild Crohn's disease, hyperlipidemia  Mixed hyperlipidemia: Chol 253, TG 82, HDL 54, LDL 185 (07/2022). In addition to heart healthy diet and lifestyle, I would recommend statin. He is reluctant at this time. Recommend CT cardiac scoring. If elevated would recommend statin, and consider ischemia testing.   Elevtaed blood pressure without diagnosis of hypertension: Low salt diet. Monitor for now.   Further recommendations after above testing  Thank you for referring the patient to [place]. Please feel free to contact with any questions.   Elder Negus, MD Pager: [phone] Office:  [phone]

## 2022-11-08 DIAGNOSIS — K5190 Ulcerative colitis, unspecified, without complications: Secondary | ICD-10-CM | POA: Diagnosis not present

## 2022-11-08 DIAGNOSIS — Z09 Encounter for follow-up examination after completed treatment for conditions other than malignant neoplasm: Secondary | ICD-10-CM | POA: Diagnosis not present

## 2022-11-08 DIAGNOSIS — Z86010 Personal history of colonic polyps: Secondary | ICD-10-CM | POA: Diagnosis not present

## 2022-11-10 ENCOUNTER — Ambulatory Visit (HOSPITAL_COMMUNITY)
Admission: RE | Admit: 2022-11-10 | Discharge: 2022-11-10 | Disposition: A | Discharge status: Home / Self Care | Payer: Self-pay | Source: Ambulatory Visit | Attending: Cardiology | Admitting: Cardiology

## 2022-11-10 DIAGNOSIS — E782 Mixed hyperlipidemia: Secondary | ICD-10-CM | POA: Insufficient documentation

## 2022-12-16 DIAGNOSIS — K621 Rectal polyp: Secondary | ICD-10-CM | POA: Diagnosis not present

## 2022-12-16 DIAGNOSIS — K5520 Angiodysplasia of colon without hemorrhage: Secondary | ICD-10-CM | POA: Diagnosis not present

## 2022-12-16 DIAGNOSIS — K5289 Other specified noninfective gastroenteritis and colitis: Secondary | ICD-10-CM | POA: Diagnosis not present

## 2022-12-16 DIAGNOSIS — K648 Other hemorrhoids: Secondary | ICD-10-CM | POA: Diagnosis not present

## 2022-12-16 DIAGNOSIS — K5730 Diverticulosis of large intestine without perforation or abscess without bleeding: Secondary | ICD-10-CM | POA: Diagnosis not present

## 2022-12-16 DIAGNOSIS — Z86010 Personal history of colonic polyps: Secondary | ICD-10-CM | POA: Diagnosis not present

## 2022-12-16 DIAGNOSIS — K5190 Ulcerative colitis, unspecified, without complications: Secondary | ICD-10-CM | POA: Diagnosis not present

## 2022-12-16 DIAGNOSIS — Z09 Encounter for follow-up examination after completed treatment for conditions other than malignant neoplasm: Secondary | ICD-10-CM | POA: Diagnosis not present

## 2022-12-20 DIAGNOSIS — F411 Generalized anxiety disorder: Secondary | ICD-10-CM | POA: Diagnosis not present

## 2022-12-23 DIAGNOSIS — Z6829 Body mass index (BMI) 29.0-29.9, adult: Secondary | ICD-10-CM | POA: Diagnosis not present

## 2022-12-23 DIAGNOSIS — J329 Chronic sinusitis, unspecified: Secondary | ICD-10-CM | POA: Diagnosis not present

## 2022-12-27 DIAGNOSIS — F411 Generalized anxiety disorder: Secondary | ICD-10-CM | POA: Diagnosis not present

## 2023-01-03 DIAGNOSIS — F411 Generalized anxiety disorder: Secondary | ICD-10-CM | POA: Diagnosis not present

## 2023-01-17 DIAGNOSIS — F411 Generalized anxiety disorder: Secondary | ICD-10-CM | POA: Diagnosis not present

## 2023-01-24 DIAGNOSIS — F411 Generalized anxiety disorder: Secondary | ICD-10-CM | POA: Diagnosis not present

## 2023-01-31 DIAGNOSIS — F411 Generalized anxiety disorder: Secondary | ICD-10-CM | POA: Diagnosis not present

## 2023-02-14 DIAGNOSIS — K5190 Ulcerative colitis, unspecified, without complications: Secondary | ICD-10-CM | POA: Diagnosis not present

## 2023-02-16 DIAGNOSIS — N289 Disorder of kidney and ureter, unspecified: Secondary | ICD-10-CM | POA: Diagnosis not present

## 2023-02-16 DIAGNOSIS — E669 Obesity, unspecified: Secondary | ICD-10-CM | POA: Diagnosis not present

## 2023-02-16 DIAGNOSIS — Z6830 Body mass index (BMI) 30.0-30.9, adult: Secondary | ICD-10-CM | POA: Diagnosis not present

## 2023-02-16 DIAGNOSIS — B351 Tinea unguium: Secondary | ICD-10-CM | POA: Diagnosis not present

## 2023-03-07 DIAGNOSIS — F411 Generalized anxiety disorder: Secondary | ICD-10-CM | POA: Diagnosis not present

## 2023-03-14 DIAGNOSIS — F411 Generalized anxiety disorder: Secondary | ICD-10-CM | POA: Diagnosis not present

## 2023-03-21 DIAGNOSIS — F411 Generalized anxiety disorder: Secondary | ICD-10-CM | POA: Diagnosis not present

## 2023-03-28 DIAGNOSIS — F411 Generalized anxiety disorder: Secondary | ICD-10-CM | POA: Diagnosis not present

## 2023-04-04 DIAGNOSIS — F411 Generalized anxiety disorder: Secondary | ICD-10-CM | POA: Diagnosis not present

## 2023-04-11 DIAGNOSIS — F411 Generalized anxiety disorder: Secondary | ICD-10-CM | POA: Diagnosis not present

## 2023-04-13 DIAGNOSIS — J019 Acute sinusitis, unspecified: Secondary | ICD-10-CM | POA: Diagnosis not present

## 2023-04-25 DIAGNOSIS — F411 Generalized anxiety disorder: Secondary | ICD-10-CM | POA: Diagnosis not present

## 2023-05-08 DIAGNOSIS — J069 Acute upper respiratory infection, unspecified: Secondary | ICD-10-CM | POA: Diagnosis not present

## 2023-05-16 DIAGNOSIS — F411 Generalized anxiety disorder: Secondary | ICD-10-CM | POA: Diagnosis not present

## 2023-05-23 DIAGNOSIS — F411 Generalized anxiety disorder: Secondary | ICD-10-CM | POA: Diagnosis not present

## 2023-06-13 DIAGNOSIS — F411 Generalized anxiety disorder: Secondary | ICD-10-CM | POA: Diagnosis not present

## 2023-06-20 DIAGNOSIS — F411 Generalized anxiety disorder: Secondary | ICD-10-CM | POA: Diagnosis not present

## 2023-06-27 DIAGNOSIS — F411 Generalized anxiety disorder: Secondary | ICD-10-CM | POA: Diagnosis not present

## 2023-07-04 DIAGNOSIS — F411 Generalized anxiety disorder: Secondary | ICD-10-CM | POA: Diagnosis not present

## 2023-07-11 DIAGNOSIS — F411 Generalized anxiety disorder: Secondary | ICD-10-CM | POA: Diagnosis not present

## 2023-07-13 DIAGNOSIS — M170 Bilateral primary osteoarthritis of knee: Secondary | ICD-10-CM | POA: Diagnosis not present

## 2023-07-20 DIAGNOSIS — M170 Bilateral primary osteoarthritis of knee: Secondary | ICD-10-CM | POA: Diagnosis not present

## 2023-07-25 DIAGNOSIS — F411 Generalized anxiety disorder: Secondary | ICD-10-CM | POA: Diagnosis not present

## 2023-07-27 DIAGNOSIS — M170 Bilateral primary osteoarthritis of knee: Secondary | ICD-10-CM | POA: Diagnosis not present

## 2023-08-01 DIAGNOSIS — F411 Generalized anxiety disorder: Secondary | ICD-10-CM | POA: Diagnosis not present

## 2023-08-08 DIAGNOSIS — F411 Generalized anxiety disorder: Secondary | ICD-10-CM | POA: Diagnosis not present

## 2023-08-15 DIAGNOSIS — F411 Generalized anxiety disorder: Secondary | ICD-10-CM | POA: Diagnosis not present

## 2023-08-22 DIAGNOSIS — F411 Generalized anxiety disorder: Secondary | ICD-10-CM | POA: Diagnosis not present

## 2023-08-29 DIAGNOSIS — F411 Generalized anxiety disorder: Secondary | ICD-10-CM | POA: Diagnosis not present

## 2023-09-05 DIAGNOSIS — F411 Generalized anxiety disorder: Secondary | ICD-10-CM | POA: Diagnosis not present

## 2023-09-12 DIAGNOSIS — F411 Generalized anxiety disorder: Secondary | ICD-10-CM | POA: Diagnosis not present

## 2023-09-19 DIAGNOSIS — F411 Generalized anxiety disorder: Secondary | ICD-10-CM | POA: Diagnosis not present

## 2023-10-10 DIAGNOSIS — F411 Generalized anxiety disorder: Secondary | ICD-10-CM | POA: Diagnosis not present

## 2023-10-20 DIAGNOSIS — Z1331 Encounter for screening for depression: Secondary | ICD-10-CM | POA: Diagnosis not present

## 2023-10-20 DIAGNOSIS — Z0000 Encounter for general adult medical examination without abnormal findings: Secondary | ICD-10-CM | POA: Diagnosis not present

## 2023-10-20 DIAGNOSIS — F3341 Major depressive disorder, recurrent, in partial remission: Secondary | ICD-10-CM | POA: Diagnosis not present

## 2023-10-20 DIAGNOSIS — Z6830 Body mass index (BMI) 30.0-30.9, adult: Secondary | ICD-10-CM | POA: Diagnosis not present

## 2023-10-20 DIAGNOSIS — Z13220 Encounter for screening for lipoid disorders: Secondary | ICD-10-CM | POA: Diagnosis not present

## 2023-10-20 DIAGNOSIS — K5150 Left sided colitis without complications: Secondary | ICD-10-CM | POA: Diagnosis not present

## 2023-10-20 DIAGNOSIS — Z125 Encounter for screening for malignant neoplasm of prostate: Secondary | ICD-10-CM | POA: Diagnosis not present

## 2023-10-31 DIAGNOSIS — F411 Generalized anxiety disorder: Secondary | ICD-10-CM | POA: Diagnosis not present

## 2023-11-14 DIAGNOSIS — F411 Generalized anxiety disorder: Secondary | ICD-10-CM | POA: Diagnosis not present

## 2023-11-21 DIAGNOSIS — F411 Generalized anxiety disorder: Secondary | ICD-10-CM | POA: Diagnosis not present

## 2023-12-05 DIAGNOSIS — F411 Generalized anxiety disorder: Secondary | ICD-10-CM | POA: Diagnosis not present

## 2023-12-19 DIAGNOSIS — F411 Generalized anxiety disorder: Secondary | ICD-10-CM | POA: Diagnosis not present

## 2024-01-02 DIAGNOSIS — F411 Generalized anxiety disorder: Secondary | ICD-10-CM | POA: Diagnosis not present

## 2024-01-16 DIAGNOSIS — F411 Generalized anxiety disorder: Secondary | ICD-10-CM | POA: Diagnosis not present

## 2024-02-08 DIAGNOSIS — M170 Bilateral primary osteoarthritis of knee: Secondary | ICD-10-CM | POA: Diagnosis not present

## 2024-02-13 DIAGNOSIS — F411 Generalized anxiety disorder: Secondary | ICD-10-CM | POA: Diagnosis not present

## 2024-02-27 DIAGNOSIS — F411 Generalized anxiety disorder: Secondary | ICD-10-CM | POA: Diagnosis not present

## 2024-03-12 DIAGNOSIS — F411 Generalized anxiety disorder: Secondary | ICD-10-CM | POA: Diagnosis not present

## 2024-03-26 DIAGNOSIS — F411 Generalized anxiety disorder: Secondary | ICD-10-CM | POA: Diagnosis not present

## 2024-04-09 DIAGNOSIS — F411 Generalized anxiety disorder: Secondary | ICD-10-CM | POA: Diagnosis not present

## 2024-04-18 DIAGNOSIS — M170 Bilateral primary osteoarthritis of knee: Secondary | ICD-10-CM | POA: Diagnosis not present

## 2024-04-23 DIAGNOSIS — F411 Generalized anxiety disorder: Secondary | ICD-10-CM | POA: Diagnosis not present

## 2024-04-25 DIAGNOSIS — M170 Bilateral primary osteoarthritis of knee: Secondary | ICD-10-CM | POA: Diagnosis not present

## 2024-05-01 DIAGNOSIS — M170 Bilateral primary osteoarthritis of knee: Secondary | ICD-10-CM | POA: Diagnosis not present

## 2024-05-07 DIAGNOSIS — F411 Generalized anxiety disorder: Secondary | ICD-10-CM | POA: Diagnosis not present

## 2024-05-21 DIAGNOSIS — F411 Generalized anxiety disorder: Secondary | ICD-10-CM | POA: Diagnosis not present
